# Patient Record
Sex: Female | Born: 2000 | Race: White | Hispanic: No | Marital: Single | Smoking: Never smoker
Health system: Southern US, Community
[De-identification: ages and names within clinical notes are randomized; demographics above are authoritative.]

## PROBLEM LIST (undated history)

## (undated) DIAGNOSIS — R55 Syncope and collapse: Secondary | ICD-10-CM

## (undated) DIAGNOSIS — J309 Allergic rhinitis, unspecified: Secondary | ICD-10-CM

## (undated) DIAGNOSIS — J45909 Unspecified asthma, uncomplicated: Secondary | ICD-10-CM

## (undated) HISTORY — DX: Unspecified asthma, uncomplicated: J45.909

## (undated) HISTORY — DX: Allergic rhinitis, unspecified: J30.9

## (undated) HISTORY — DX: Syncope and collapse: R55

---
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
## 2022-05-16 ENCOUNTER — Encounter: Payer: Self-pay | Admitting: Internal Medicine

## 2022-05-16 ENCOUNTER — Ambulatory Visit: Payer: BC Managed Care – PPO | Admitting: Internal Medicine

## 2022-05-16 VITALS — BP 108/64 | HR 93 | Temp 98.0°F | Resp 18 | Ht 63.5 in | Wt 139.8 lb

## 2022-05-16 DIAGNOSIS — J3089 Other allergic rhinitis: Secondary | ICD-10-CM | POA: Diagnosis not present

## 2022-05-16 DIAGNOSIS — J4540 Moderate persistent asthma, uncomplicated: Secondary | ICD-10-CM | POA: Diagnosis not present

## 2022-05-16 MED ORDER — FLUTICASONE PROPIONATE 50 MCG/ACT NA SUSP: 1.0000 | Freq: Every day | NASAL | 2 refills | Status: DC

## 2022-05-16 MED ORDER — MONTELUKAST SODIUM 10 MG PO TABS: 10.0000 mg | ORAL_TABLET | Freq: Every day | ORAL | 1 refills | Status: DC

## 2022-05-16 MED ORDER — LEVOCETIRIZINE DIHYDROCHLORIDE 5 MG PO TABS: 5.0000 mg | ORAL_TABLET | Freq: Every evening | ORAL | 1 refills | Status: AC

## 2022-05-16 MED ORDER — BUDESONIDE-FORMOTEROL FUMARATE 160-4.5 MCG/ACT IN AERO: 2.0000 | INHALATION_SPRAY | Freq: Two times a day (BID) | RESPIRATORY_TRACT | 6 refills | Status: DC

## 2022-05-16 NOTE — Patient Instructions (Addendum)
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 - Daily controller medication(s): Singulair 10mg  daily and Symbicort 160/4.69mcg two puffs twice daily with spacer - Prior to physical activity:  Symbicort 2 puffs   10-15 minutes before physical activity. - Rescue medications:  Symbicort 2 puffs every 4-6 hours as needed  - Changes during respiratory infections or worsening symptoms: Increase Symbicort  to 3 puffs twice daily for TWO WEEKS. - Asthma control goals:  * Full participation in all desired activities (may need albuterol before activity) * Albuterol use two time or less a week on average (not counting use with activity) * Cough interfering with sleep two time or less a month * Oral steroids no more than once a year * No hospitalizations  Allergic  Rhinitis: not well  controlled  - Testing today showed borderline to grass pollen, weed pollen, tree pollen, positive to mold, dog, mouse - Copy of test results provided.  - Avoidance measures provided. - Continue with: Flonase (fluticasone) one spray per nostril daily - Start taking: Xyzal (levocetirizine) 5mg  tablet once daily and Singulair (montelukast) 10 mg daily - You can use an extra dose of the antihistamine, if needed, for breakthrough symptoms.  - Consider nasal saline rinses 1-2 times daily to remove allergens from the nasal cavities as well as help with mucous clearance (this is especially helpful to do before the nasal sprays are given) - Consider allergy shots as a means of long-term control and can reduce lifetime use of medications  - Allergy shots "re-train" and "reset" the immune system to ignore environmental allergens and decrease the resulting immune response to those allergens (sneezing, itchy watery eyes, runny nose, nasal congestion, etc).    - Allergy shots improve symptoms in 75-85%  - Allergy shots are  the only potential permanent and disease modifying option  - We can discuss more at the next appointment if the medications are not working for you.  Follow up: 3 months   Thank you so much for letting me partake in your care today.  Don't hesitate to reach out if you have any additional concerns!  , MD  [facility] [place], [place]  Control of Mold Allergen   Mold and fungi can grow on a variety of surfaces provided certain temperature and moisture conditions exist.  Outdoor molds grow on plants, decaying vegetation and soil.  The major outdoor mold, Alternaria and Cladosporium, are found in very high numbers during hot and dry conditions.  Generally, a late Summer - Fall peak is seen for common outdoor fungal spores.  Rain will temporarily lower outdoor mold spore count, but counts rise rapidly when the rainy period ends.  The most important indoor molds are Aspergillus and Penicillium.  Dark, humid and poorly ventilated basements are ideal sites for mold growth.  The next most common sites of mold growth are the bathroom and the kitchen.  Outdoor (Seasonal) Mold Control  Positive outdoor molds via skin testing: Alternaria, Bipolaris (Helminthsporium), Drechslera (Curvalaria), and Mucor  Use air conditioning and keep windows closed Avoid exposure to decaying vegetation. Avoid leaf raking. Avoid grain handling. Consider wearing a face mask if working in moldy areas.    Indoor (Perennial) Mold Control   Positive indoor molds via skin testing: Aspergillus, Penicillium, Fusarium, Aureobasidium (Pullulara), Phoma, and Trichophyton  Maintain humidity below 50%. Clean washable surfaces with 5% bleach solution. Remove  sources e.g. contaminated carpets.  Control of Dog or Cat Allergen  Avoidance is the best way to manage a dog or cat allergy. If you have a dog or cat and are allergic to dog or cats, consider removing the dog or cat from the home. If you have  a dog or cat but don't want to find it a new home, or if your family wants a pet even though someone in the household is allergic, here are some strategies that may help keep symptoms at bay:  Keep the pet out of your bedroom and restrict it to only a few rooms. Be advised that keeping the dog or cat in only one room will not limit the allergens to that room. Don't pet, hug or kiss the dog or cat; if you do, wash your hands with soap and water. High-efficiency particulate air (HEPA) cleaners run continuously in a bedroom or living room can reduce allergen levels over time. Regular use of a high-efficiency vacuum cleaner or a central vacuum can reduce allergen levels. Giving your dog or cat a bath at least once a week can reduce airborne allergen.

## 2022-05-16 NOTE — Progress Notes (Addendum)
New Patient Note  RE: Jessica Hatfield MRN: [id number] DOB: 11/01/2000 Date of Office Visit: 05/16/2022  Consult requested by: Berline Lopes, MD Primary care provider: Berline Lopes, MD  Chief Complaint: Allergy Testing (Environmental:All - lived n College dorm when allergies started/Food: no Hx)  History of Present Illness: I had the pleasure of seeing Jessica Hatfield for initial evaluation at the [facility] on 05/16/2022. She is a 21 y.o. female, who is referred here by Berline Lopes, MD for the evaluation of chronic rhinitis and asthma  History obtained from patient  and  mother and chart review .    Asthma:  Diagnosed at age this past year while at college .  Current symptoms include cough and shortness of breath Daily  daytime symptoms in past month, 0 nighttime awakenings in past month Using rescue inhaler daily  Limitations to daily activity: mild 0 ED visits, 0 UC visits and 1 oral steroids in the past year 0 number of lifetime hospitalizations, 0 number of lifetime intubations.  Identified Triggers:  rhinitis, exercise, respiratory illness, smoke exposure, and cold air Prior PFTs or spirometry: none prior  Previously used therapies: symbicort mcg 3 puffs a day (using as SMART inhlaer)  Current regimen:  Maintenance: symbicort  Rescue: Albuterol 2 puffs q4-6 hrs PRN, not using prior to exercise  Up-to-date with pneumonia, and Covid-19, vaccines. History of prior pneumonias: walking PNA in 2023  History of prior COVID-19 infection: denies  Smoking exposure: denies   Chronic rhinitis: started this past year while at college  Symptoms include: nasal congestion, rhinorrhea, post nasal drainage, sneezing, watery eyes, itchy eyes, and itchy nose  Occurs  persistent since OCT 2022 Potential triggers: none identitified  Treatments tried: flonase, mucinex, netipot  Previous allergy testing: no History of reflux/heartburn: no History of chronic sinusitis  or sinus surgery: no Nonallergic triggers:  denies      Assessment and Plan: Jessica Hatfield is a 21 y.o. female with: Moderate persistent asthma without complication - Plan: Spirometry with Graph, Allergy Test  Other allergic rhinitis - Plan: Allergy Test Plan: Patient Instructions  Moderate Persistent Asthma: not well controlled - Breathing test today showed: inflammation in your lungs that was reversible   PLAN:  - Spacer use reviewed. - Daily controller medication(s): Singulair 10mg  daily and Symbicort 160/4.52mcg two puffs twice daily with spacer - Prior to physical activity:  Symbicort 2 puffs   10-15 minutes before physical activity. - Rescue medications:  Symbicort 2 puffs every 4-6 hours as needed  - Changes during respiratory infections or worsening symptoms: Increase Symbicort  to 3 puffs twice daily for TWO WEEKS. - Asthma control goals:  * Full participation in all desired activities (may need albuterol before activity) * Albuterol use two time or less a week on average (not counting use with activity) * Cough interfering with sleep two time or less a month * Oral steroids no more than once a year * No hospitalizations  Allergic  Rhinitis: not well  controlled  - Testing today showed borderline to grass pollen, weed pollen, tree pollen, positive to mold, dog, mouse - Copy of test results provided.  - Avoidance measures provided. - Continue with: Flonase (fluticasone) one spray per nostril daily - Start taking: Xyzal (levocetirizine) 5mg  tablet once daily and Singulair (montelukast) 10 mg daily - You can use an extra dose of the antihistamine, if needed, for breakthrough symptoms.  - Consider nasal saline rinses 1-2 times daily to remove allergens from the  nasal cavities as well as help with mucous clearance (this is especially helpful to do before the nasal sprays are given) - Consider allergy shots as a means of long-term control and can reduce lifetime use of medications  -  Allergy shots "re-train" and "reset" the immune system to ignore environmental allergens and decrease the resulting immune response to those allergens (sneezing, itchy watery eyes, runny nose, nasal congestion, etc).    - Allergy shots improve symptoms in 75-85%  - Allergy shots are the only potential permanent and disease modifying option  - We can discuss more at the next appointment if the medications are not working for you.  Follow up: 3 months   Thank you so much for letting me partake in your care today.  Don't hesitate to reach out if you have any additional concerns!  Jessica Marion, MD  [facility] [place] [place]  Control of Mold Allergen   Mold and fungi can grow on a variety of surfaces provided certain temperature and moisture conditions exist.  Outdoor molds grow on plants, decaying vegetation and soil.  The major outdoor mold, Alternaria and Cladosporium, are found in very high numbers during hot and dry conditions.  Generally, a late Summer - Fall peak is seen for common outdoor fungal spores.  Rain will temporarily lower outdoor mold spore count, but counts rise rapidly when the rainy period ends.  The most important indoor molds are Aspergillus and Penicillium.  Dark, humid and poorly ventilated basements are ideal sites for mold growth.  The next most common sites of mold growth are the bathroom and the kitchen.  Outdoor (Seasonal) Mold Control  Positive outdoor molds via skin testing: Alternaria, Bipolaris (Helminthsporium), Drechslera (Curvalaria), and Mucor  Use air conditioning and keep windows closed Avoid exposure to decaying vegetation. Avoid leaf raking. Avoid grain handling. Consider wearing a face mask if working in moldy areas.    Indoor (Perennial) Mold Control   Positive indoor molds via skin testing: Aspergillus, Penicillium, Fusarium, Aureobasidium (Pullulara), Phoma, and Trichophyton  Maintain humidity below 50%. Clean washable  surfaces with 5% bleach solution. Remove sources e.g. contaminated carpets.  Control of Dog or Cat Allergen  Avoidance is the best way to manage a dog or cat allergy. If you have a dog or cat and are allergic to dog or cats, consider removing the dog or cat from the home. If you have a dog or cat but don't want to find it a new home, or if your family wants a pet even though someone in the household is allergic, here are some strategies that may help keep symptoms at bay:  Keep the pet out of your bedroom and restrict it to only a few rooms. Be advised that keeping the dog or cat in only one room will not limit the allergens to that room. Don't pet, hug or kiss the dog or cat; if you do, wash your hands with soap and water. High-efficiency particulate air (HEPA) cleaners run continuously in a bedroom or living room can reduce allergen levels over time. Regular use of a high-efficiency vacuum cleaner or a central vacuum can reduce allergen levels. Giving your dog or cat a bath at least once a week can reduce airborne allergen.   No follow-ups on file.  Meds ordered this encounter  Medications   budesonide-formoterol (SYMBICORT) 160-4.5 MCG/ACT inhaler    Sig: Inhale 2 puffs into the lungs in the morning and at bedtime.    Dispense:  1  each    Refill:  6   montelukast (SINGULAIR) 10 MG tablet    Sig: Take 1 tablet (10 mg total) by mouth at bedtime.    Dispense:  90 tablet    Refill:  1   fluticasone (FLONASE) 50 MCG/ACT nasal spray    Sig: Place 1 spray into both nostrils daily.    Dispense:  16 g    Refill:  2   levocetirizine (XYZAL) 5 MG tablet    Sig: Take 1 tablet (5 mg total) by mouth every evening.    Dispense:  90 tablet    Refill:  1   Lab Orders  No laboratory test(s) ordered today    Other allergy screening: Asthma: yes Rhino conjunctivitis: yes Food allergy: no Medication allergy: no Hymenoptera allergy: no Urticaria: no Eczema:no History of recurrent  infections suggestive of immunodeficency: no  Diagnostics: Spirometry:  Tracings reviewed. Her effort: It was hard to get consistent efforts and there is a question as to whether this reflects a maximal maneuver. FVC: 3.18L FEV1: 2.15L, 67% predicted FEV1/FVC ratio: 68% Interpretation: Spirometry consistent with moderate obstructive disease.  After 4 puffs of xopenex FEV increased by 450 cc and 21%.  This is a positive post bronchodilator response.  Please see scanned spirometry results for details.  Skin Testing: Environmental allergy panel. Adequate controls, testing as below Results interpreted by myself and discussed with patient/family.  Airborne Adult Perc - 05/16/22 1501     Time Antigen Placed 1501    Allergen Manufacturer Lavella Hammock    Location Back    Number of Test 59    Panel 1 Select    2. Control-Histamine 1 mg/ml 4+    4. Ontario Negative    5. Guatemala Negative    6. Johnson 2+    7. Kentucky Blue 2+    8. Meadow Fescue 2+    9. Perennial Rye Negative    10. Sweet Vernal Negative    11. Timothy Negative    12. Cocklebur Negative    13. Burweed Marshelder Negative    14. Ragweed, short Negative    15. Ragweed, Giant Negative    16. Plantain,  English 2+    17. Lamb's Quarters 2+    18. Sheep Sorrell Negative    19. Rough Pigweed Negative    20. Marsh Elder, Rough Negative    21. Mugwort, Common Negative    22. Ash mix Negative    23. Birch mix Negative    24. Beech American Negative    25. Box, Elder 2+    26. Cedar, red 2+    27. Cottonwood, [place] Negative    28. Elm mix 2+    29. Hickory Negative    30. Maple mix Negative    31. Oak, [place] mix Negative    32. Pecan Pollen Negative    33. Pine mix 2+    34. Sycamore Eastern Negative    35. Plain View, Black Pollen Negative    36. Alternaria alternata 3+    37. Cladosporium Herbarum Negative    38. Aspergillus mix 3+    39. Penicillium mix 3+    40. Bipolaris sorokiniana (Helminthosporium) 2+    41.  Drechslera spicifera (Curvularia) 2+    42. Mucor plumbeus 3+    43. Fusarium moniliforme 2+    44. Aureobasidium pullulans (pullulara) 3+    45. Rhizopus oryzae Negative    46. Botrytis cinera Negative    47. Epicoccum nigrum Negative  48. Phoma betae 3+    49. Candida Albicans Negative    50. Trichophyton mentagrophytes 3+    51. Mite, D Farinae  5,000 AU/ml Negative    52. Mite, D Pteronyssinus  5,000 AU/ml Negative    53. Cat Hair 10,000 BAU/ml Negative    54.  Dog Epithelia 3+    55. Mixed Feathers Negative    56. Horse Epithelia Negative    57. Cockroach, German Negative    58. Mouse 3+    59. Tobacco Leaf Negative             Past Medical History: Patient Active Problem List   Diagnosis Date Noted   Syncopal seizure (HCC) 04/04/2018   Past Medical History:  Diagnosis Date   Syncope and collapse    Past Surgical History: History reviewed. No pertinent surgical history. Medication List:  Current Outpatient Medications  Medication Sig Dispense Refill   budesonide-formoterol (SYMBICORT) 160-4.5 MCG/ACT inhaler Inhale 2 puffs into the lungs in the morning and at bedtime. 1 each 6   fluticasone (FLONASE) 50 MCG/ACT nasal spray Place 1 spray into both nostrils daily. 16 g 2   JUNEL FE 1/20 1-20 MG-MCG tablet Take 1 tablet by mouth daily.     levocetirizine (XYZAL) 5 MG tablet Take 1 tablet (5 mg total) by mouth every evening. 90 tablet 1   montelukast (SINGULAIR) 10 MG tablet Take 1 tablet (10 mg total) by mouth at bedtime. 90 tablet 1   No current facility-administered medications for this visit.   Allergies: No Known Allergies Social History: Social History   Socioeconomic History   Marital status: Single    Spouse name: Not on file   Number of children: Not on file   Years of education: Not on file   Highest education level: Not on file  Occupational History   Not on file  Tobacco Use   Smoking status: Never    Passive exposure: Never   Smokeless  tobacco: Never  Vaping Use   Vaping Use: Never used  Substance and Sexual Activity   Alcohol use: Never   Drug use: Never   Sexual activity: Never  Other Topics Concern   Not on file  Social History Narrative   Nashley is a rising 10th grade student.   She attends [employer].   She lives with both parents. She has two siblings.   She enjoys singing, listening to music, and theater.   Social Determinants of Health   Financial Resource Strain: Not on file  Food Insecurity: Not on file  Transportation Needs: Not on file  Physical Activity: Not on file  Stress: Not on file  Social Connections: Not on file   Lives in a single-family home that is 19 years old.  There are no roaches in the house but is 2 feet off the floor.  They have dust mite precautions on the bed but not pillows.  She does have a job where she is exposed to fumes, chemicals and dust.  There is no HEPA filter in the home and home is not near an interstate industrial area. Smoking: No exposure Occupation: Hydrographic surveyor History: Immunologist in the house: no Engineer, civil (consulting) in the family room: no Carpet in the bedroom: yes Heating: gas Cooling: heat pump Pet: yes 1 dog without access to bedroom  Family History: Family History  Problem Relation Age of Onset   Kidney failure Paternal Grandfather      ROS: All others negative  except as noted per HPI.   Objective: BP 108/64   Pulse 93   Temp 98 F (36.7 C)   Resp 18   Ht 5' 3.5" (1.613 m)   Wt 139 lb 12.8 oz (63.4 kg)   SpO2 100%   BMI 24.38 kg/m  Body mass index is 24.38 kg/m.  General Appearance:  Alert, cooperative, no distress, appears stated age  Head:  Normocephalic, without obvious abnormality, atraumatic  Eyes:  Conjunctiva clear, EOM's intact  Nose: Nares normal,  pale mucosa with clear rhinnohea, no visible anterior polyps, and septum midline  Throat: Lips, tongue normal; teeth and gums normal, normal posterior  oropharynx and no tonsillar exudate  Neck: Supple, symmetrical  Lungs:   clear to auscultation bilaterally, Respirations unlabored, no coughing, prolonged expiratory phase   Heart:  regular rate and rhythm and no murmur, Appears well perfused  Extremities: No edema  Skin: Skin color, texture, turgor normal, no rashes or lesions on visualized portions of skin  Neurologic: No gross deficits   The plan was reviewed with the patient/family, and all questions/concerned were addressed.  It was my pleasure to see Jessica Hatfield today and participate in her care. Please feel free to contact me with any questions or concerns.  Sincerely,  Ferol Luz, MD Allergy & Immunology  [facility] office: [phone] [facility] office: [phone]

## 2022-05-19 ENCOUNTER — Other Ambulatory Visit: Payer: Self-pay | Admitting: Allergy

## 2022-05-19 NOTE — Progress Notes (Signed)
Called by mother.  She had initial visit this week with Dr. Marlynn Perking.  She was advised to increase her inhaler dose.  She started the new inhaler dose this morning.  She took xyzal and singulair yesterday which also were new additions per mother.   After using the new inhaler this morning she states her chest felt tight and hurt.   That sensation went away but she continues to have coughing.  She was not having symptoms before using symbicort.  She took symbicort 2 puffs this morning for first time and developed the chest pain and tightness.  She was taking symbicort 2 puffs twice a day before without issue.     Mother is not quite sure what is being treated and why.  Mother would like more information about what is going on with Jessica Hatfield and the medication regimen she should be on.    She states they live in [place] and requested to see Dr. Lucie Leather but did not have any soon new pt openings thus was promised if they saw someone else initially they would be able to see Dr. Lucie Leather in follow-up.   I advised her to just take symbicort 1 puff twice a day for now (the equivalent of what she was on before and tolerated) and keep the daily xyzal and singulair.    I had Dr. Dellis Anes schedule her with Dr Lucie Leather in the 930 slot on Thursday 05/24/22.  Advised office would let them know if an opening become available on Monday.  They are out of town Wednesday.

## 2022-05-24 ENCOUNTER — Ambulatory Visit: Payer: BC Managed Care – PPO | Admitting: Allergy and Immunology

## 2022-05-24 ENCOUNTER — Telehealth: Payer: Self-pay

## 2022-05-24 ENCOUNTER — Encounter: Payer: Self-pay | Admitting: Allergy and Immunology

## 2022-05-24 VITALS — BP 104/58 | HR 80 | Resp 14

## 2022-05-24 DIAGNOSIS — J301 Allergic rhinitis due to pollen: Secondary | ICD-10-CM

## 2022-05-24 DIAGNOSIS — J4550 Severe persistent asthma, uncomplicated: Secondary | ICD-10-CM | POA: Diagnosis not present

## 2022-05-24 DIAGNOSIS — J324 Chronic pansinusitis: Secondary | ICD-10-CM

## 2022-05-24 DIAGNOSIS — J3089 Other allergic rhinitis: Secondary | ICD-10-CM | POA: Diagnosis not present

## 2022-05-24 MED ORDER — TRELEGY ELLIPTA 200-62.5-25 MCG/ACT IN AEPB: INHALATION_SPRAY | RESPIRATORY_TRACT | 5 refills | Status: DC

## 2022-05-24 MED ORDER — RYALTRIS 665-25 MCG/ACT NA SUSP: NASAL | 5 refills | Status: AC

## 2022-05-24 MED ORDER — ALBUTEROL SULFATE HFA 108 (90 BASE) MCG/ACT IN AERS: INHALATION_SPRAY | RESPIRATORY_TRACT | 1 refills | Status: DC

## 2022-05-24 NOTE — Progress Notes (Signed)
Patient seen by Dr. Lucie Leather in-clinic today.

## 2022-05-24 NOTE — Patient Instructions (Addendum)
  1.  Treat and prevent inflammation:  A. Trelegy 200 - 1 inhalation 1 time per day (empty lungs) B. Montelukast 10 mg - 1 tablet 1 time per day B. Ryaltris - 2 sprays each nostril 2 times per day (specialty pharmacy)  2. If needed:  A. Nasal saline B. Xyzal 5 mg - 1 tablet 1 time per day C. Albuterol HFA - 2 inhalations every 4-6 hours  3. Obtain a chest X-Ray  4. Obtain a sinus CT scan  5. Obtain blood - CBC w/d, area 2 aeroallergen profile  6. Contact clinic with update in 2 weeks  7. Plan for fall flu vaccine

## 2022-05-24 NOTE — Progress Notes (Signed)
[place] [facility] [place] [facility] [place] - [place] - [place]   Follow-up Note  Referring Provider: Berline Lopes, MD Primary Provider: Berline Lopes, MD Date of Office Visit: 05/24/2022  Subjective:   Jessica Hatfield (DOB: 08/15/2001) is a 21 y.o. female who [facility] in re-evaluation of the following:  HPI: Champayne returns to this clinic in evaluation of persistent respiratory tract symptoms.  I have never seen her in this clinic and she was last evaluated by Dr. Marlynn Perking on 16 May 2022.  In review, she has had a long history of allergic disease involving her nose and chest which was not a particularly big issue and was easily handled until May 2023.  Since that point in time she has had persistent stuffiness and nasal congestion and coughing and some of the material emanating from her nose is yellow-green.  She does not have any anosmia.  She has had problems with her inhalers.  Symbicort makes her cough.  She was changed from Symbicort 80 to Symbicort 160 which gave rise to chest tightness.  She has been treated with 4 antibiotics over the course of the past several months and has been given a course of prednisone which has not helped her at all.  She does not have any symptoms to suggest active reflux.  Allergies as of 05/24/2022   No Known Allergies      Medication List    budesonide-formoterol 160-4.5 MCG/ACT inhaler Commonly known as: Symbicort Inhale 2 puffs into the lungs in the morning and at bedtime.   fluticasone 50 MCG/ACT nasal spray Commonly known as: FLONASE Place 1 spray into both nostrils daily.   Junel FE 1/20 1-20 MG-MCG tablet Generic drug: norethindrone-ethinyl estradiol-FE Take 1 tablet by mouth daily.   levocetirizine 5 MG tablet Commonly known as: XYZAL Take 1 tablet (5 mg total) by mouth every evening.   montelukast 10 MG tablet Commonly known as: SINGULAIR Take 1 tablet (10 mg total) by mouth at  bedtime.    Past Medical History:  Diagnosis Date   Allergic rhinitis    Asthma    Syncope and collapse     No past surgical history on file.  Review of systems negative except as noted in HPI / PMHx or noted below:  Review of Systems  Constitutional: Negative.   HENT: Negative.    Eyes: Negative.   Respiratory: Negative.    Cardiovascular: Negative.   Gastrointestinal: Negative.   Genitourinary: Negative.   Musculoskeletal: Negative.   Skin: Negative.   Neurological: Negative.   Endo/Heme/Allergies: Negative.   Psychiatric/Behavioral: Negative.       Objective:   Vitals:   05/24/22 0955  BP: (!) 104/58  Pulse: 80  Resp: 14  SpO2: 98%          Physical Exam Constitutional:      Appearance: She is not diaphoretic.  HENT:     Head: Normocephalic.     Right Ear: Tympanic membrane, ear canal and external ear normal.     Left Ear: Tympanic membrane, ear canal and external ear normal.     Nose: Nose normal. No mucosal edema or rhinorrhea.     Mouth/Throat:     Pharynx: Uvula midline. No oropharyngeal exudate.  Eyes:     Conjunctiva/sclera: Conjunctivae normal.  Neck:     Thyroid: No thyromegaly.     Trachea: Trachea normal. No tracheal tenderness or tracheal deviation.  Cardiovascular:     Rate and Rhythm:  Normal rate and regular rhythm.     Heart sounds: Normal heart sounds, S1 normal and S2 normal. No murmur heard. Pulmonary:     Effort: No respiratory distress.     Breath sounds: Normal breath sounds. No stridor. No wheezing or rales.  Lymphadenopathy:     Head:     Right side of head: No tonsillar adenopathy.     Left side of head: No tonsillar adenopathy.     Cervical: No cervical adenopathy.  Skin:    Findings: No erythema or rash.     Nails: There is no clubbing.  Neurological:     Mental Status: She is alert.     Diagnostics:    Spirometry was performed and demonstrated an FEV1 of 2.39 at 73 % of predicted.FEV1/FVC = 0.67  Assessment  and Plan:   1. Not well controlled severe persistent asthma   2. Perennial allergic rhinitis   3. Seasonal allergic rhinitis due to pollen   4. Chronic pansinusitis     1.  Treat and prevent inflammation:  A. Trelegy 200 - 1 inhalation 1 time per day (empty lungs) B. Montelukast 10 mg - 1 tablet 1 time per day B. Ryaltris - 2 sprays each nostril 2 times per day (specialty pharmacy)  2. If needed:  A. Nasal saline B. Xyzal 5 mg - 1 tablet 1 time per day C. Albuterol HFA - 2 inhalations every 4-6 hours  3. Obtain a chest X-Ray  4. Obtain a sinus CT scan  5. Obtain blood - CBC w/d, area 2 aeroallergen profile  6. Contact clinic with update in 2 weeks  7. Plan for fall flu vaccine  Leia appears to have significant inflammation and irritation of her respiratory tract and for some reason this appears to have accelerated during May 2023.  Certainly her atopic respiratory disease and immune dysregulation could have become much worse but I think we need to consider other etiologic factors also contributing to her issue and we will have her obtain imaging of both her upper and lower airway and also further define her immune dysregulation with the blood test noted above.  I have started her on a collection of agents utilized to address inflammation of her airway.  She will be returning to [facility] next week so I have asked her to contact me by telephone noting her response to the therapy specified above and I will contact her with the results of her diagnostic studies once it is available for review.  Laurette Schimke, MD Allergy / Immunology [facility]

## 2022-05-24 NOTE — Telephone Encounter (Signed)
Called and informed mom Vernona Rieger) that patient has been scheduled for sinus ct scan on Tuesday, August 15th, needing to check-in at 3:30pm at [facility].     Note: Prior authorization approval # [id number] Expires on 06/22/22.  Order form has been faxed to [facility].

## 2022-05-26 LAB — CBC WITH DIFFERENTIAL
EOS (ABSOLUTE): 0 10*3/uL (ref 0.0–0.4)
Hematocrit: 38 % (ref 34.0–46.6)
Hemoglobin: 12.8 g/dL (ref 11.1–15.9)
Immature Granulocytes: 0 %
Lymphocytes Absolute: 1.5 10*3/uL (ref 0.7–3.1)
MCHC: 33.7 g/dL (ref 31.5–35.7)
Monocytes: 5 %

## 2022-05-26 LAB — ALLERGENS W/TOTAL IGE AREA 2

## 2022-05-28 ENCOUNTER — Encounter: Payer: Self-pay | Admitting: Allergy and Immunology

## 2022-05-30 ENCOUNTER — Telehealth: Payer: Self-pay

## 2022-05-30 ENCOUNTER — Other Ambulatory Visit: Payer: Self-pay | Admitting: *Deleted

## 2022-05-30 DIAGNOSIS — J324 Chronic pansinusitis: Secondary | ICD-10-CM

## 2022-05-30 LAB — ALLERGENS W/TOTAL IGE AREA 2
Aspergillus Fumigatus IgE: <0.1 kU/L
Bermuda Grass IgE: <0.1 kU/L
Cat Dander IgE: <0.1 kU/L
Cedar, Mountain IgE: <0.1 kU/L
Cockroach, German IgE: <0.1 kU/L
Common Silver Birch IgE: <0.1 kU/L
Cottonwood IgE: <0.1 kU/L
Dog Dander IgE: <0.1 kU/L
IgE (Immunoglobulin E), Serum: 26 IU/mL (ref 6–495)
Johnson Grass IgE: <0.1 kU/L
Maple/Box Elder IgE: <0.1 kU/L
Mouse Urine IgE: <0.1 kU/L
Pecan, Hickory IgE: <0.1 kU/L
Penicillium Chrysogen IgE: <0.1 kU/L
Pigweed, Rough IgE: <0.1 kU/L
Sheep Sorrel IgE Qn: <0.1 kU/L
Timothy Grass IgE: <0.1 kU/L
White Mulberry IgE: <0.1 kU/L

## 2022-05-30 LAB — CBC WITH DIFFERENTIAL
Basophils Absolute: 0 10*3/uL (ref 0.0–0.2)
Basos: 0 %
Eos: 1 %
Immature Grans (Abs): 0 10*3/uL (ref 0.0–0.1)
Lymphs: 20 %
MCH: 27.9 pg (ref 26.6–33.0)
MCV: 83 fL (ref 79–97)
Monocytes Absolute: 0.4 10*3/uL (ref 0.1–0.9)
Neutrophils Absolute: 5.6 10*3/uL (ref 1.4–7.0)
Neutrophils: 74 %
RBC: 4.58 x10E6/uL (ref 3.77–5.28)
RDW: 11.9 % (ref 11.7–15.4)
WBC: 7.6 10*3/uL (ref 3.4–10.8)

## 2022-05-30 MED ORDER — CLINDAMYCIN HCL 150 MG PO CAPS: ORAL_CAPSULE | ORAL | 0 refills | Status: DC

## 2022-05-30 NOTE — Telephone Encounter (Signed)
Mom informed and RX has been sent. Lab order has also been sent to [facility].

## 2022-05-30 NOTE — Telephone Encounter (Signed)
-----   Message from Jessica Priest, MD sent at 05/30/2022 10:38 AM EDT ----- Please inform Jessica Hatfield that I have reviewed her CT scan and she has chronic sinusitis and some of her sinus cavities are completely filled up.  There is a suggestion that there may be a right molar involved with this infection as well.  She needs to do 2 things.  First, we need to place her on antibiotics for 1 month. Clindamycin 150 - 1 tablet 3 times per day for 30 days  Second, we need to obtain some more blood test to make sure that her immune system is intact.  On her previous blood test she did not identify any allergies.  So there may be a different mechanism giving rise to this problem.  Check IgA/G/M, anti-pneumo 23 ab, anti-tetanus ab  Needs to see me after 30 days of antibiotic.

## 2022-05-31 ENCOUNTER — Encounter: Payer: Self-pay | Admitting: *Deleted

## 2022-06-08 LAB — STREP PNEUMONIAE 23 SEROTYPES IGG
Pneumo Ab Type 1*: 1 ug/mL — ABNORMAL LOW (ref >1.3)
Pneumo Ab Type 12 (12F)*: 0.2 ug/mL — ABNORMAL LOW (ref >1.3)
Pneumo Ab Type 14*: <0.1 ug/mL — ABNORMAL LOW (ref >1.3)
Pneumo Ab Type 17 (17F)*: 0.2 ug/mL — ABNORMAL LOW (ref >1.3)
Pneumo Ab Type 19 (19F)*: 2.9 ug/mL (ref >1.3)
Pneumo Ab Type 2*: 1.6 ug/mL (ref >1.3)
Pneumo Ab Type 20*: 1.7 ug/mL (ref >1.3)
Pneumo Ab Type 22 (22F)*: >26 ug/mL (ref >1.3)
Pneumo Ab Type 23 (23F)*: 0.2 ug/mL — ABNORMAL LOW (ref >1.3)
Pneumo Ab Type 26 (6B)*: 0.8 ug/mL — ABNORMAL LOW (ref >1.3)
Pneumo Ab Type 3*: 0.7 ug/mL — ABNORMAL LOW (ref >1.3)
Pneumo Ab Type 34 (10A)*: 0.8 ug/mL — ABNORMAL LOW (ref >1.3)
Pneumo Ab Type 4*: 0.6 ug/mL — ABNORMAL LOW (ref >1.3)
Pneumo Ab Type 43 (11A)*: >7.6 ug/mL (ref >1.3)
Pneumo Ab Type 5*: 0.3 ug/mL — ABNORMAL LOW (ref >1.3)
Pneumo Ab Type 51 (7F)*: <0.1 ug/mL — ABNORMAL LOW (ref >1.3)
Pneumo Ab Type 54 (15B)*: 0.1 ug/mL — ABNORMAL LOW (ref >1.3)
Pneumo Ab Type 56 (18C)*: <0.1 ug/mL — ABNORMAL LOW (ref >1.3)
Pneumo Ab Type 57 (19A)*: 2.2 ug/mL (ref >1.3)
Pneumo Ab Type 68 (9V)*: 0.2 ug/mL — ABNORMAL LOW (ref >1.3)
Pneumo Ab Type 70 (33F)*: 1.6 ug/mL (ref >1.3)
Pneumo Ab Type 8*: 1.5 ug/mL (ref >1.3)
Pneumo Ab Type 9 (9N)*: 0.1 ug/mL — ABNORMAL LOW (ref >1.3)

## 2022-06-08 LAB — DIPHTHERIA / TETANUS ANTIBODY PANEL
Diphtheria Ab: 1.98 IU/mL (ref <0.10)
Tetanus Ab, IgG: 0.17 IU/mL (ref <0.10)

## 2022-06-08 LAB — IGG, IGA, IGM
IgA/Immunoglobulin A, Serum: 111 mg/dL (ref 87–352)
IgG (Immunoglobin G), Serum: 1001 mg/dL (ref 719–1475)
IgM (Immunoglobulin M), Srm: 65 mg/dL (ref 58–230)

## 2022-06-12 ENCOUNTER — Telehealth: Payer: Self-pay | Admitting: Allergy and Immunology

## 2022-06-12 NOTE — Telephone Encounter (Signed)
Mom states patients roommates have tested positive for COVID. Patient took a rapid test this morning and came back negative. Mom is concerned this will affect her immune system. Mom wants the results for patients blood work she had done a couple weeks ago.

## 2022-07-02 ENCOUNTER — Ambulatory Visit: Payer: BC Managed Care – PPO | Admitting: Allergy and Immunology

## 2022-07-02 VITALS — BP 98/58 | HR 82 | Temp 98.1°F | Resp 16

## 2022-07-02 DIAGNOSIS — J324 Chronic pansinusitis: Secondary | ICD-10-CM | POA: Diagnosis not present

## 2022-07-02 DIAGNOSIS — J3089 Other allergic rhinitis: Secondary | ICD-10-CM

## 2022-07-02 DIAGNOSIS — J4550 Severe persistent asthma, uncomplicated: Secondary | ICD-10-CM | POA: Diagnosis not present

## 2022-07-02 NOTE — Patient Instructions (Signed)
  1.  Treat and prevent inflammation:  A. Trelegy 200 - 1 inhalation 3-7 times per week (empty lungs) B. Montelukast 10 mg - 1 tablet 1 time per day B. Ryaltris - 2 sprays each nostril 1-2 times per day (specialty pharmacy)  2. If needed:  A. Nasal saline B. Xyzal 5 mg - 1 tablet 1 time per day C. Albuterol HFA - 2 inhalations every 4-6 hours  3. Plan for fall flu vaccine  4. Return to clinic in 12 weeks or earlier if problem

## 2022-07-02 NOTE — Progress Notes (Unsigned)
[place] - [place] - [place] - [place] - [place]   Follow-up Note  Referring Provider: Berline Lopes, MD Primary Provider: Berline Lopes, MD Date of Office Visit: 07/02/2022  Subjective:   Jessica Hatfield (DOB: April 18, 2001) is a 21 y.o. female who [facility] in re-evaluation of the following:  HPI: Jessica Hatfield returns to this clinic in evaluation of persistent respiratory tract symptoms with a combination of asthma, allergic rhinitis, chronic sinusitis.  I last saw her in this clinic for her initial evaluation with me on 24 May 2022.  She underwent 30 days of clindamycin administration for CT scan documented chronic sinusitis and continues to use a collection of anti-inflammatory agents for both her upper and lower airway.  As a result of that treatment she has no significant respiratory tract symptoms involving either her upper airway or her lower airway and she does not need to use a short acting bronchodilator and overall feels 100% at this point in time.  Allergies as of 07/02/2022   No Known Allergies      Medication List    albuterol 108 (90 Base) MCG/ACT inhaler Commonly known as: Ventolin HFA Can inhale two puffs every four to six hours as needed for cough or wheeze.   clindamycin 150 MG capsule Commonly known as: CLEOCIN Take one capsule 3 times daily for 30 days.   Junel FE 1/20 1-20 MG-MCG tablet Generic drug: norethindrone-ethinyl estradiol-FE Take 1 tablet by mouth daily.   levocetirizine 5 MG tablet Commonly known as: XYZAL Take 1 tablet (5 mg total) by mouth every evening.   montelukast 10 MG tablet Commonly known as: SINGULAIR Take 1 tablet (10 mg total) by mouth at bedtime.   Ryaltris 022-33 MCG/ACT Susp Generic drug: Olopatadine-Mometasone Use 2 sprays each nostril 2 times per day   Trelegy Ellipta 200-62.5-25 MCG/ACT Aepb Generic drug: Fluticasone-Umeclidin-Vilant Inhale one dose once daily to  prevent cough or wheeze.  Rinse, gargle, and spit after use.    Past Medical History:  Diagnosis Date   Allergic rhinitis    Asthma    Syncope and collapse     No past surgical history on file.  Review of systems negative except as noted in HPI / PMHx or noted below:  Review of Systems  Constitutional: Negative.   HENT: Negative.    Eyes: Negative.   Respiratory: Negative.    Cardiovascular: Negative.   Gastrointestinal: Negative.   Genitourinary: Negative.   Musculoskeletal: Negative.   Skin: Negative.   Neurological: Negative.   Endo/Heme/Allergies: Negative.   Psychiatric/Behavioral: Negative.       Objective:   Vitals:   07/02/22 0833  BP: (!) 98/58  Pulse: 82  Resp: 16  Temp: 98.1 F (36.7 C)  SpO2: 99%          Physical Exam Constitutional:      Appearance: She is not diaphoretic.  HENT:     Head: Normocephalic.     Right Ear: Tympanic membrane, ear canal and external ear normal.     Left Ear: Tympanic membrane, ear canal and external ear normal.     Nose: Nose normal. No mucosal edema or rhinorrhea.     Mouth/Throat:     Pharynx: Uvula midline. No oropharyngeal exudate.  Eyes:     Conjunctiva/sclera: Conjunctivae normal.  Neck:     Thyroid: No thyromegaly.     Trachea: Trachea normal. No tracheal tenderness or tracheal deviation.  Cardiovascular:     Rate and  Rhythm: Normal rate and regular rhythm.     Heart sounds: Normal heart sounds, S1 normal and S2 normal. No murmur heard. Pulmonary:     Effort: No respiratory distress.     Breath sounds: Normal breath sounds. No stridor. No wheezing or rales.  Lymphadenopathy:     Head:     Right side of head: No tonsillar adenopathy.     Left side of head: No tonsillar adenopathy.     Cervical: No cervical adenopathy.  Skin:    Findings: No erythema or rash.     Nails: There is no clubbing.  Neurological:     Mental Status: She is alert.     Diagnostics:    Spirometry was performed and  demonstrated an FEV1 of 2.68 at 102 % of predicted.  Her previous FEV1 was 2.39.  Results of blood tests obtained 25 May 2022 identifies WBC 7.6, absolute eosinophil 0, absolute lymphocyte 1500, hemoglobin 12.8, IgE 26 KU/L, IgG 1001 Mg/DL, IgM 65 MGs/DL, IgA 100 MG/DL, relatively low levels of antipneumococcal antibodies on greater than 50% of serotypes measured on a Pneumo 23 serotype assay, tetanus IgG 0.17 U/mL, diphtheria 1.98 U/mL, no IgE antibodies directed against allergens in a area 2 aeroallergen IgE profile.  Results of a sinus CT scan identifies moderate mucosal thickening in the right maxillary sinus with air-fluid level, near complete opacification of the ethmoid air cells, opacification of the right greater than left sphenoid and frontal sinus.  Results of a chest x-ray obtained 25 May 2022 identifies no significant abnormality.  Assessment and Plan:   1. Asthma, severe persistent, well-controlled   2. Perennial allergic rhinitis   3. Chronic pansinusitis    1.  Treat and prevent inflammation:  A. Trelegy 200 - 1 inhalation 3-7 times per week (empty lungs) B. Montelukast 10 mg - 1 tablet 1 time per day B. Ryaltris - 2 sprays each nostril 1-2 times per day (specialty pharmacy)  2. If needed:  A. Nasal saline B. Xyzal 5 mg - 1 tablet 1 time per day C. Albuterol HFA - 2 inhalations every 4-6 hours  3. Plan for fall flu vaccine  4. Return to clinic in 12 weeks or earlier if problem  Jessica Hatfield is doing much better at this point in time in the treatment of respiratory tract inflammation and chronic infection.  We will see if we can lower some of her anti-inflammatory therapy by decreasing her Trelegy and Ryaltris as noted above.  I will see her back in this clinic in 12 weeks or earlier if there is a problem.  Jessica Katz, MD Allergy / Immunology [place]

## 2022-07-03 ENCOUNTER — Encounter: Payer: Self-pay | Admitting: Allergy and Immunology

## 2022-07-30 ENCOUNTER — Ambulatory Visit: Payer: BC Managed Care – PPO | Admitting: Allergy and Immunology

## 2022-10-01 ENCOUNTER — Encounter: Payer: Self-pay | Admitting: Allergy and Immunology

## 2022-10-01 ENCOUNTER — Ambulatory Visit: Payer: BC Managed Care – PPO | Admitting: Allergy and Immunology

## 2022-10-01 VITALS — BP 110/62 | HR 100 | Resp 14

## 2022-10-01 DIAGNOSIS — J4550 Severe persistent asthma, uncomplicated: Secondary | ICD-10-CM

## 2022-10-01 DIAGNOSIS — Z23 Encounter for immunization: Secondary | ICD-10-CM | POA: Diagnosis not present

## 2022-10-01 DIAGNOSIS — J3089 Other allergic rhinitis: Secondary | ICD-10-CM

## 2022-10-01 NOTE — Progress Notes (Unsigned)
[place] - [place] - [place] - [place] - [place]   Follow-up Note  Referring Provider: Berline Lopes, MD Primary Provider: Berline Lopes, MD Date of Office Visit: 10/01/2022  Subjective:   Jessica Hatfield (DOB: 12/09/2000) is a 21 y.o. female who [facility] 10[facility]/2022 in re-evaluation of the following:  HPI: Jessica Hatfield returns to this clinic in evaluation of asthma, allergic rhinitis, history of chronic sinusitis.  I last saw her in this clinic 02 July 2022.  During her evaluation in this clinic we diagnosed her with a very bad case of prolonged/chronic sinusitis and treated her with 30 days of clindamycin and that resulted in dramatic improvement regarding all of her respiratory tract symptoms.  She is slowly tapered off her trilogy and her right [place] although she continues on montelukast on a regular basis.  She had absolutely no respiratory tract symptoms at all.  She does not use any albuterol.  Allergies as of 10/01/2022   No Known Allergies      Medication List    albuterol 108 (90 Base) MCG/ACT inhaler Commonly known as: Ventolin HFA Can inhale two puffs every four to six hours as needed for cough or wheeze.   Junel FE 1/20 1-20 MG-MCG tablet Generic drug: norethindrone-ethinyl estradiol-FE Take 1 tablet by mouth daily.   levocetirizine 5 MG tablet Commonly known as: XYZAL Take 1 tablet (5 mg total) by mouth every evening.   montelukast 10 MG tablet Commonly known as: SINGULAIR Take 1 tablet (10 mg total) by mouth at bedtime.   Ryaltris 161-09 MCG/ACT Susp Generic drug: Olopatadine-Mometasone Use 2 sprays each nostril 2 times per day   Trelegy Ellipta 200-62.5-25 MCG/ACT Aepb Generic drug: Fluticasone-Umeclidin-Vilant Inhale one dose once daily to prevent cough or wheeze.  Rinse, gargle, and spit after use.    Past Medical History:  Diagnosis Date   Allergic rhinitis    Asthma    Syncope and collapse      History reviewed. No pertinent surgical history.  Review of systems negative except as noted in HPI / PMHx or noted below:  Review of Systems  Constitutional: Negative.   HENT: Negative.    Eyes: Negative.   Respiratory: Negative.    Cardiovascular: Negative.   Gastrointestinal: Negative.   Genitourinary: Negative.   Musculoskeletal: Negative.   Skin: Negative.   Neurological: Negative.   Endo/Heme/Allergies: Negative.   Psychiatric/Behavioral: Negative.       Objective:   Vitals:   10/01/22 1510  BP: 110/62  Pulse: 100  Resp: 14  SpO2: 98%          Physical Exam Constitutional:      Appearance: She is not diaphoretic.  HENT:     Head: Normocephalic.     Right Ear: Tympanic membrane, ear canal and external ear normal.     Left Ear: Tympanic membrane, ear canal and external ear normal.     Nose: Nose normal. No mucosal edema or rhinorrhea.     Mouth/Throat:     Pharynx: Uvula midline. No oropharyngeal exudate.  Eyes:     Conjunctiva/sclera: Conjunctivae normal.  Neck:     Thyroid: No thyromegaly.     Trachea: Trachea normal. No tracheal tenderness or tracheal deviation.  Cardiovascular:     Rate and Rhythm: Normal rate and regular rhythm.     Heart sounds: Normal heart sounds, S1 normal and S2 normal. No murmur heard. Pulmonary:     Effort: No respiratory distress.  Breath sounds: Normal breath sounds. No stridor. No wheezing or rales.  Lymphadenopathy:     Head:     Right side of head: No tonsillar adenopathy.     Left side of head: No tonsillar adenopathy.     Cervical: No cervical adenopathy.  Skin:    Findings: No erythema or rash.     Nails: There is no clubbing.  Neurological:     Mental Status: She is alert.     Diagnostics:    Spirometry was performed and demonstrated an FEV1 of 2.71 at 82 % of predicted.  Assessment and Plan:   1. Asthma, severe persistent, well-controlled   2. Perennial allergic rhinitis    1.  Treat and  prevent inflammation:  A. Montelukast 10 mg - 1 tablet 1 time per day B. Trelegy 200 - 1 inhalation 1-7 times per week   C. Ryaltris - 2 sprays each nostril 1-7 times per week   2. If needed:  A. Nasal saline B. Xyzal 5 mg - 1 tablet 1 time per day C. Albuterol HFA - 2 inhalations every 4-6 hours  3. Flu vaccine administered in clinic today  4. Return to clinic in 6 months or earlier if problem  Jessica Hatfield has really done very well and is slowly been able to taper down all of her medications directed against respiratory tract inflammation other than consistent use of a leukotriene modifier.  I am not sure what her requirement will be to prevent her from developing a significant problem with her airway in the future but we will let her use trilogy and Ryaltris just a few times per week at this point and see what happens as we move forward over the course of the next 6 months.  Jessica Schimke, MD Allergy / Immunology [facility]

## 2022-10-01 NOTE — Patient Instructions (Addendum)
  1.  Treat and prevent inflammation:  A. Montelukast 10 mg - 1 tablet 1 time per day B. Trelegy 200 - 1 inhalation 1-7 times per week   C. Ryaltris - 2 sprays each nostril 1-7 times per week   2. If needed:  A. Nasal saline B. Xyzal 5 mg - 1 tablet 1 time per day C. Albuterol HFA - 2 inhalations every 4-6 hours  3. Flu vaccine administered in clinic today  4. Return to clinic in 6 months or earlier if problem

## 2022-10-02 ENCOUNTER — Encounter: Payer: Self-pay | Admitting: Allergy and Immunology

## 2022-11-14 ENCOUNTER — Other Ambulatory Visit: Payer: Self-pay | Admitting: Internal Medicine

## 2023-01-09 ENCOUNTER — Telehealth: Payer: Self-pay | Admitting: Allergy and Immunology

## 2023-01-09 NOTE — Telephone Encounter (Signed)
Please advice . Thank you

## 2023-01-09 NOTE — Telephone Encounter (Signed)
Patient states she is doing good but is wanting to know if she should continue taking Singulair and Xyzal. If so, she would need a refill sent in to [facility] on [address].

## 2023-01-15 NOTE — Telephone Encounter (Signed)
Mom informed and verbalized understanding

## 2023-06-18 ENCOUNTER — Telehealth: Payer: Self-pay | Admitting: Allergy and Immunology

## 2023-06-18 MED ORDER — MONTELUKAST SODIUM 10 MG PO TABS: ORAL_TABLET | ORAL | 5 refills | Status: DC

## 2023-06-18 NOTE — Telephone Encounter (Signed)
Patient is requesting a refill on Montelukast sent to [facility] on [address] in [place]

## 2023-06-18 NOTE — Telephone Encounter (Signed)
Refill sent and mom informed.

## 2023-07-10 ENCOUNTER — Encounter: Payer: Self-pay | Admitting: Allergy and Immunology

## 2023-07-10 ENCOUNTER — Ambulatory Visit: Payer: BC Managed Care – PPO | Admitting: Allergy and Immunology

## 2023-07-10 VITALS — BP 110/62 | HR 97 | Resp 16 | Wt 136.8 lb

## 2023-07-10 DIAGNOSIS — J4550 Severe persistent asthma, uncomplicated: Secondary | ICD-10-CM | POA: Diagnosis not present

## 2023-07-10 DIAGNOSIS — Z8709 Personal history of other diseases of the respiratory system: Secondary | ICD-10-CM | POA: Diagnosis not present

## 2023-07-10 DIAGNOSIS — J3089 Other allergic rhinitis: Secondary | ICD-10-CM | POA: Diagnosis not present

## 2023-07-10 MED ORDER — MONTELUKAST SODIUM 10 MG PO TABS: ORAL_TABLET | ORAL | 11 refills | Status: DC

## 2023-07-10 MED ORDER — BUDESONIDE-FORMOTEROL FUMARATE 160-4.5 MCG/ACT IN AERO: INHALATION_SPRAY | RESPIRATORY_TRACT | 5 refills | Status: AC

## 2023-07-10 NOTE — Progress Notes (Unsigned)
[place] - [place] - [place] - [place] - [place]   Follow-up Note  Referring Provider: Berline Lopes, MD Primary Provider: Berline Lopes, MD Date of Office Visit: 07/10/2023  Subjective:   Jessica Hatfield (DOB: 2001-05-14) is a 22 y.o. female who [facility] on 07[facility]10/2023 in re-evaluation of the following:  HPI: Jessica Hatfield returns to this clinic in evaluation of asthma, allergic rhinitis, and a history of chronic sinusitis.  I last saw her in this clinic 01 October 2022.  She did relatively well during the spring and she did very well during the summer and stopped all of her medications and had no significant issues involving either her head or chest.   Unfortunately, since she has been back to [place] this fall she has redeveloped some cough and she feels as though there is some drainage and something stuck in her throat and she has been using her short acting bronchodilator which does appear to help her cough somewhat.  She did restart her Ryaltris.  She did not restart her montelukast or her controller inhaler.  Allergies as of 07/10/2023   No Known Allergies      Medication List    albuterol 108 (90 Base) MCG/ACT inhaler Commonly known as: Ventolin HFA Can inhale two puffs every four to six hours as needed for cough or wheeze.   Junel FE 1/20 1-20 MG-MCG tablet Generic drug: norethindrone-ethinyl estradiol-FE Take 1 tablet by mouth daily.   levocetirizine 5 MG tablet Commonly known as: XYZAL Take 1 tablet (5 mg total) by mouth every evening.   montelukast 10 MG tablet Commonly known as: SINGULAIR TAKE 1 TABLET(10 MG) BY MOUTH AT BEDTIME   Ryaltris 665-25 MCG/ACT Susp Generic drug: Olopatadine-Mometasone Use 2 sprays each nostril 2 times per day    Past Medical History:  Diagnosis Date   Allergic rhinitis    Asthma    Syncope and collapse     History reviewed. No pertinent surgical history.  Review of systems negative  except as noted in HPI / PMHx or noted below:  Review of Systems  Constitutional: Negative.   HENT: Negative.    Eyes: Negative.   Respiratory: Negative.    Cardiovascular: Negative.   Gastrointestinal: Negative.   Genitourinary: Negative.   Musculoskeletal: Negative.   Skin: Negative.   Neurological: Negative.   Endo/Heme/Allergies: Negative.   Psychiatric/Behavioral: Negative.       Objective:   Vitals:   07/10/23 1045  BP: 110/62  Pulse: 97  Resp: 16  SpO2: 99%      Weight: 136 lb 12.8 oz (62.1 kg)   Physical Exam Constitutional:      Appearance: She is not diaphoretic.  HENT:     Head: Normocephalic.     Right Ear: Tympanic membrane, ear canal and external ear normal.     Left Ear: Tympanic membrane, ear canal and external ear normal.     Nose: Nose normal. No mucosal edema or rhinorrhea.     Mouth/Throat:     Pharynx: Uvula midline. No oropharyngeal exudate.  Eyes:     Conjunctiva/sclera: Conjunctivae normal.  Neck:     Thyroid: No thyromegaly.     Trachea: Trachea normal. No tracheal tenderness or tracheal deviation.  Cardiovascular:     Rate and Rhythm: Normal rate and regular rhythm.     Heart sounds: Normal heart sounds, S1 normal and S2 normal. No murmur heard. Pulmonary:     Effort: No respiratory distress.  Breath sounds: Normal breath sounds. No stridor. No wheezing or rales.  Lymphadenopathy:     Head:     Right side of head: No tonsillar adenopathy.     Left side of head: No tonsillar adenopathy.     Cervical: No cervical adenopathy.  Skin:    Findings: No erythema or rash.     Nails: There is no clubbing.  Neurological:     Mental Status: She is alert.     Diagnostics: Spirometry was performed and demonstrated an FEV1 of 2.39 at 73 % of predicted.  Assessment and Plan:   1. Asthma, severe persistent, well-controlled   2. Perennial allergic rhinitis   3. History of chronic sinusitis    1.  Treat and prevent inflammation:  A.  Montelukast 10 mg - 1 tablet 1 time per day B. Symbicort 160 - 2 inhalations 1-2 times per day C. Ryaltris - 2 sprays each nostril 1-2 times per day  2. If needed:  A. Nasal saline B. Xyzal 5 mg - 1 tablet 1 time per day C. Symbicort 160 - 2 inhalations every 6 hours  3. Obtain fall flu vaccine  4. Return to clinic in 6 months or earlier if problem  Jessica Hatfield appears to to have some inflammation of her airway and I encouraged her to restart her Symbicort and Ryaltris and whether or not she needs to use montelukast is something that she can work out for herself.  She is a very intelligent person and she understands how her medications work and appropriate dosing of her medications depending on disease activity.  She needs to find a dose of her anti-inflammatory medications that prevents her from having respiratory tract symptoms as she resides in [place].  We can use her Symbicort not just as a controller agent but also as her anti-inflammatory rescue medicine as noted above.  I will see her back in this clinic in 6 months or earlier if there is a problem.  Jessica Schimke, MD Allergy / Immunology [facility]

## 2023-07-10 NOTE — Patient Instructions (Addendum)
  1.  Treat and prevent inflammation:  A. Montelukast 10 mg - 1 tablet 1 time per day B. Symbicort 160 - 2 inhalations 1-2 times per day C. Ryaltris - 2 sprays each nostril 1-2 times per day  2. If needed:  A. Nasal saline B. Xyzal 5 mg - 1 tablet 1 time per day C. Symbicort 160 - 2 inhalations every 6 hours  3. Obtain fall flu vaccine  4. Return to clinic in 6 months or earlier if problem

## 2023-07-11 ENCOUNTER — Encounter: Payer: Self-pay | Admitting: Allergy and Immunology

## 2024-07-10 ENCOUNTER — Other Ambulatory Visit: Payer: Self-pay | Admitting: Allergy and Immunology

## 2024-08-06 ENCOUNTER — Other Ambulatory Visit: Payer: Self-pay | Admitting: *Deleted

## 2024-08-06 MED ORDER — ALBUTEROL SULFATE HFA 108 (90 BASE) MCG/ACT IN AERS: INHALATION_SPRAY | RESPIRATORY_TRACT | 0 refills | Status: AC

## 2024-08-19 ENCOUNTER — Other Ambulatory Visit: Payer: Self-pay | Admitting: Allergy and Immunology

## 2024-08-31 ENCOUNTER — Ambulatory Visit: Admitting: Allergy and Immunology

## 2024-10-01 ENCOUNTER — Ambulatory Visit: Admitting: Allergy and Immunology
